# Patient Record
Sex: Female | Born: 1990 | Hispanic: Refuse to answer | Marital: Single | Smoking: Never smoker
Health system: Southern US, Community
[De-identification: ages and names within clinical notes are randomized; demographics above are authoritative.]

## PROBLEM LIST (undated history)

## (undated) DIAGNOSIS — K8020 Calculus of gallbladder without cholecystitis without obstruction: Secondary | ICD-10-CM

## (undated) DIAGNOSIS — J45909 Unspecified asthma, uncomplicated: Secondary | ICD-10-CM

## (undated) DIAGNOSIS — T7840XA Allergy, unspecified, initial encounter: Secondary | ICD-10-CM

## (undated) HISTORY — PX: EYE SURGERY: SHX253

## (undated) HISTORY — PX: WISDOM TOOTH EXTRACTION: SHX21

## (undated) HISTORY — DX: Unspecified asthma, uncomplicated: J45.909

## (undated) HISTORY — DX: Calculus of gallbladder without cholecystitis without obstruction: K80.20

## (undated) HISTORY — PX: JOINT REPLACEMENT: SHX530

## (undated) HISTORY — DX: Allergy, unspecified, initial encounter: T78.40XA

---
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
## 2011-04-04 ENCOUNTER — Ambulatory Visit (INDEPENDENT_AMBULATORY_CARE_PROVIDER_SITE_OTHER): Payer: BC Managed Care – PPO | Admitting: Internal Medicine

## 2011-04-04 ENCOUNTER — Encounter: Payer: Self-pay | Admitting: Internal Medicine

## 2011-04-04 ENCOUNTER — Other Ambulatory Visit (INDEPENDENT_AMBULATORY_CARE_PROVIDER_SITE_OTHER): Payer: BC Managed Care – PPO

## 2011-04-04 DIAGNOSIS — Z309 Encounter for contraceptive management, unspecified: Secondary | ICD-10-CM | POA: Insufficient documentation

## 2011-04-04 DIAGNOSIS — L259 Unspecified contact dermatitis, unspecified cause: Secondary | ICD-10-CM

## 2011-04-04 DIAGNOSIS — L309 Dermatitis, unspecified: Secondary | ICD-10-CM

## 2011-04-04 DIAGNOSIS — S83209A Unspecified tear of unspecified meniscus, current injury, unspecified knee, initial encounter: Secondary | ICD-10-CM

## 2011-04-04 DIAGNOSIS — IMO0002 Reserved for concepts with insufficient information to code with codable children: Secondary | ICD-10-CM

## 2011-04-04 DIAGNOSIS — Z0000 Encounter for general adult medical examination without abnormal findings: Secondary | ICD-10-CM

## 2011-04-04 LAB — CBC WITH DIFFERENTIAL/PLATELET
Basophils Absolute: 0 10*3/uL (ref 0.0–0.1)
Eosinophils Absolute: 0.1 10*3/uL (ref 0.0–0.7)
HCT: 36 % (ref 36.0–46.0)
Lymphs Abs: 2 10*3/uL (ref 0.7–4.0)
MCHC: 32.6 g/dL (ref 30.0–36.0)
MCV: 85.7 fl (ref 78.0–100.0)
Monocytes Absolute: 0.6 10*3/uL (ref 0.1–1.0)
Neutrophils Relative %: 49 % (ref 43.0–77.0)
Platelets: 230 10*3/uL (ref 150.0–400.0)
RDW: 14.7 % — ABNORMAL HIGH (ref 11.5–14.6)
WBC: 5.3 10*3/uL (ref 4.5–10.5)

## 2011-04-04 LAB — COMPREHENSIVE METABOLIC PANEL
CO2: 24 mEq/L (ref 19–32)
Calcium: 9.6 mg/dL (ref 8.4–10.5)
Chloride: 106 mEq/L (ref 96–112)
Creatinine, Ser: 0.9 mg/dL (ref 0.4–1.2)
GFR: 82.77 mL/min (ref >60.00)
Glucose, Bld: 85 mg/dL (ref 70–99)
Total Bilirubin: 0.3 mg/dL (ref 0.3–1.2)
Total Protein: 7.6 g/dL (ref 6.0–8.3)

## 2011-04-04 LAB — LIPID PANEL
HDL: 60.2 mg/dL (ref >39.00)
Triglycerides: 29 mg/dL (ref 0.0–149.0)

## 2011-04-04 LAB — TSH: TSH: 1.13 u[IU]/mL (ref 0.35–5.50)

## 2011-04-04 MED ORDER — NORGESTIM-ETH ESTRAD TRIPHASIC 0.18/0.215/0.25 MG-25 MCG PO TABS: 1.0000 | ORAL_TABLET | Freq: Every day | ORAL | Status: DC

## 2011-04-04 MED ORDER — PIMECROLIMUS 1 % EX CREA: TOPICAL_CREAM | CUTANEOUS | Status: AC

## 2011-04-04 NOTE — Patient Instructions (Signed)
Eczema / Atopic Dermatitis Atopic dermatitis, or eczema, is an inherited type of sensitive skin. Often people with eczema have a family history of allergies, asthma, or hay fever. It causes a red itchy rash and dry scaly skin. The itchiness may occur before the skin rash and may be very intense. It is not contagious. Eczema is generally worse during the cooler winter months and often improves with the warmth of summer. Eczema usually starts showing signs in infancy. Some children outgrow eczema, but it may last through adulthood. Flare-ups may be caused by:  Eating something or contact with something you are sensitive or allergic to.   Stress.  DIAGNOSIS The diagnosis of eczema is usually based upon symptoms and medical history. TREATMENT Eczema cannot be cured, but symptoms usually can be controlled with treatment or avoidance of allergens (things to which you are sensitive or allergic to).  Controlling the itching and scratching.   Use over-the-counter antihistamines as directed for itching. It is especially useful at night when the itching tends to be worse.   Use over-the-counter steroid creams as directed for itching.   Scratching makes the rash and itching worse and may cause impetigo (a skin infection) if fingernails are contaminated (dirty).   Keeping the skin well moisturized with creams every day. This will seal in moisture and help prevent dryness. Lotions containing alcohol and water can dry the skin and are not recommended.   Limiting exposure to allergens.   Recognizing situations that cause stress.   Developing a plan to manage stress.  HOME CARE INSTRUCTIONS  Take prescription and over-the-counter medicines as directed by your caregiver.   Do not use anything on the skin without checking with your caregiver.   Keep baths or showers short (5 minutes) in warm (not hot) water. Use mild cleansers for bathing. You may add non-perfumed bath oil to the bath water. It is best  to avoid soap and bubble bath.   Immediately after a bath or shower, when the skin is still damp, apply a moisturizing ointment to the entire body. This ointment should be a petroleum ointment. This will seal in moisture and help prevent dryness. The thicker the ointment the better. These should be unscented.   Keep fingernails cut short and wash hands often. If your child has eczema, it may be necessary to put soft gloves or mittens on your child at night.   Dress in clothes made of cotton or cotton blends. Dress lightly, as heat increases itching.   Avoid foods that may cause flare-ups. Common foods include cow's milk, peanut butter, eggs and wheat.   Keep a child with eczema away from anyone with fever blisters. The virus that causes fever blisters (herpes simplex) can cause a serious skin infection in children with eczema.  SEEK MEDICAL CARE IF:  Itching interferes with sleep.   The rash gets worse or is not better within one week following treatment.   The rash looks infected (pus or soft yellow scabs).   You or your child has an oral temperature above 102 F (38.9 C).   Your baby is older than 3 months with a rectal temperature of 100.5 F (38.1 C) or higher for more than 1 day.   The rash flares up after contact with someone who has fever blisters.  SEEK IMMEDIATE MEDICAL CARE IF:  Your baby is older than 3 months with a rectal temperature of 102 F (38.9 C) or higher.   Your baby is older than 3 months   or younger with a rectal temperature of 100.4 F (38 C) or higher.  Document Released: 08/16/2000 Document Re-Released: 11/13/2009 [employer] Patient Information 2011 [employer]. 

## 2011-04-04 NOTE — Assessment & Plan Note (Signed)
PT referral.

## 2011-04-04 NOTE — Assessment & Plan Note (Signed)
Exam done and routine labs ordered

## 2011-04-04 NOTE — Assessment & Plan Note (Addendum)
Try [facility], derm referral done

## 2011-04-04 NOTE — Progress Notes (Signed)
Subjective:    Patient ID: Alexandria Robinson, female    DOB: 1991/02/10, 20 y.o.   MRN: [id number]  HPI She is new to me and comes in for an annual exam but she has some complaints, concerns, and requests. She does not want me to do her PAP smear today - she asks me to refer her to a GYN. She wants a referral to PT to work on right knee pain and right leg weakness s/p meniscus repair. She has a rash around her neck that has not gone away with otc hydrocortisone cream and she wants to see a dermatologist.    Review of Systems  Constitutional: Negative for fever, chills, diaphoresis, activity change, appetite change, fatigue and unexpected weight change.  HENT: Negative for sore throat, facial swelling, trouble swallowing, neck pain, neck stiffness and voice change.   Eyes: Negative for photophobia, redness and visual disturbance.  Respiratory: Negative.   Cardiovascular: Negative.   Gastrointestinal: Negative.   Genitourinary: Negative for dysuria, urgency, frequency, hematuria, flank pain, decreased urine volume, vaginal bleeding, vaginal discharge, enuresis, difficulty urinating, genital sores, vaginal pain, menstrual problem, pelvic pain and dyspareunia.  Musculoskeletal: Positive for arthralgias (right knee). Negative for myalgias, back pain, joint swelling and gait problem.  Skin: Positive for rash. Negative for pallor and wound.  Neurological: Positive for weakness (right leg). Negative for dizziness, tremors, seizures, syncope, facial asymmetry, speech difficulty, light-headedness, numbness and headaches.  Hematological: Negative for adenopathy. Does not bruise/bleed easily.  Psychiatric/Behavioral: Negative.        Objective:   Physical Exam  Vitals reviewed. Constitutional: She is oriented to person, place, and time. She appears well-developed and well-nourished. No distress.  HENT:  Head: Normocephalic and atraumatic.  Right Ear: External ear normal.  Left Ear: External ear  normal.  Nose: Nose normal.  Mouth/Throat: Oropharynx is clear and moist. No oropharyngeal exudate.  Eyes: Conjunctivae and EOM are normal. Pupils are equal, round, and reactive to light. Right eye exhibits no discharge. Left eye exhibits no discharge. No scleral icterus.  Neck: Normal range of motion. Neck supple. No JVD present. No tracheal deviation present. No thyromegaly present.  Cardiovascular: Normal rate, regular rhythm, normal heart sounds and intact distal pulses.  Exam reveals no gallop and no friction rub.   No murmur heard. Pulmonary/Chest: Effort normal and breath sounds normal. No stridor. No respiratory distress. She has no wheezes. She has no rales. She exhibits no tenderness.  Abdominal: Soft. Bowel sounds are normal. She exhibits no distension and no mass. There is no tenderness. There is no rebound and no guarding.  Musculoskeletal: Normal range of motion. She exhibits no edema and no tenderness.  Lymphadenopathy:    She has no cervical adenopathy.  Neurological: She is alert and oriented to person, place, and time. She has normal reflexes. She displays normal reflexes. No cranial nerve deficit. She exhibits normal muscle tone. Coordination normal.  Skin: Skin is warm, dry and intact. Rash noted. No abrasion, no bruising, no burn, no ecchymosis, no laceration, no lesion, no petechiae and no purpura noted. Rash is not macular, not papular, not nodular, not pustular, not vesicular and not urticarial. She is not diaphoretic. No cyanosis or erythema. No pallor. Nails show no clubbing.          Around her neck she has a ring of lichenification with scale and mild erythema  Psychiatric: She has a normal mood and affect. Her behavior is normal. Judgment and thought content normal.  Assessment & Plan:

## 2011-04-04 NOTE — Assessment & Plan Note (Signed)
Continue OCP, she will see a GYN for a PAP smear in the next month

## 2011-07-03 ENCOUNTER — Encounter: Payer: Self-pay | Admitting: Internal Medicine

## 2011-07-03 ENCOUNTER — Ambulatory Visit (INDEPENDENT_AMBULATORY_CARE_PROVIDER_SITE_OTHER): Payer: BC Managed Care – PPO | Admitting: Internal Medicine

## 2011-07-03 VITALS — BP 104/62 | HR 60 | Temp 98.3°F | Resp 16 | Wt 179.0 lb

## 2011-07-03 DIAGNOSIS — J209 Acute bronchitis, unspecified: Secondary | ICD-10-CM

## 2011-07-03 MED ORDER — PSEUDOEPH-CHLORPHEN-HYDROCOD 60-4-5 MG/5ML PO SOLN: 5.0000 mL | Freq: Four times a day (QID) | ORAL | Status: DC | PRN

## 2011-07-03 MED ORDER — AZITHROMYCIN 500 MG PO TABS: 500.0000 mg | ORAL_TABLET | Freq: Every day | ORAL | Status: AC

## 2011-07-03 NOTE — Progress Notes (Signed)
  Subjective:    Patient ID: Alexandria Robinson, female    DOB: April 02, 1991, 20 y.o.   MRN: [id number]  Cough This is a new problem. The current episode started 1 to 4 weeks ago. The problem has been gradually worsening. The problem occurs every few hours. The cough is productive of purulent sputum. Associated symptoms include chills, nasal congestion, rhinorrhea and a sore throat. Pertinent negatives include no chest pain, ear congestion, ear pain, fever, headaches, heartburn, hemoptysis, myalgias, postnasal drip, rash, shortness of breath, sweats, weight loss or wheezing. The symptoms are aggravated by nothing. She has tried nothing for the symptoms. The treatment provided no relief.      Review of Systems  Constitutional: Positive for chills. Negative for fever, weight loss, diaphoresis, activity change, appetite change, fatigue and unexpected weight change.  HENT: Positive for congestion, sore throat and rhinorrhea. Negative for ear pain, facial swelling, trouble swallowing, neck pain, voice change and postnasal drip.   Eyes: Negative.   Respiratory: Positive for cough. Negative for apnea, hemoptysis, choking, chest tightness, shortness of breath, wheezing and stridor.   Cardiovascular: Negative for chest pain, palpitations and leg swelling.  Gastrointestinal: Negative.  Negative for heartburn.  Genitourinary: Negative.   Musculoskeletal: Negative for myalgias, back pain, joint swelling, arthralgias and gait problem.  Skin: Negative for color change, pallor, rash and wound.  Neurological: Negative for dizziness, tremors, seizures, syncope, facial asymmetry, speech difficulty, weakness, numbness and headaches.  Hematological: Negative for adenopathy. Does not bruise/bleed easily.  Psychiatric/Behavioral: Negative.        Objective:   Physical Exam  Vitals reviewed. Constitutional: She is oriented to person, place, and time. She appears well-developed and well-nourished. No distress.  HENT:    Head: Normocephalic and atraumatic. No trismus in the jaw.  Right Ear: Hearing, tympanic membrane, external ear and ear canal normal.  Left Ear: Hearing, tympanic membrane, external ear and ear canal normal.  Nose: Mucosal edema and rhinorrhea present. Right sinus exhibits no maxillary sinus tenderness and no frontal sinus tenderness. Left sinus exhibits no maxillary sinus tenderness and no frontal sinus tenderness.  Mouth/Throat: Mucous membranes are normal. Mucous membranes are not pale, not dry and not cyanotic. No uvula swelling. Posterior oropharyngeal erythema present. No oropharyngeal exudate, posterior oropharyngeal edema or tonsillar abscesses.  Eyes: Conjunctivae are normal. Right eye exhibits no discharge. Left eye exhibits no discharge. No scleral icterus.  Neck: Normal range of motion. Neck supple. No JVD present. No tracheal deviation present. No thyromegaly present.  Cardiovascular: Normal rate, regular rhythm, normal heart sounds and intact distal pulses.  Exam reveals no gallop and no friction rub.   No murmur heard. Pulmonary/Chest: Effort normal and breath sounds normal. No stridor. No respiratory distress. She has no wheezes. She has no rales. She exhibits no tenderness.  Abdominal: Soft. Bowel sounds are normal. She exhibits no distension and no mass. There is no tenderness. There is no rebound and no guarding.  Musculoskeletal: Normal range of motion. She exhibits no edema and no tenderness.  Lymphadenopathy:    She has no cervical adenopathy.  Neurological: She is oriented to person, place, and time. She displays normal reflexes. She exhibits normal muscle tone. Coordination normal.  Skin: Skin is warm and dry. No rash noted. She is not diaphoretic. No erythema. No pallor.  Psychiatric: She has a normal mood and affect. Her behavior is normal. Judgment and thought content normal.          Assessment & Plan:

## 2011-07-03 NOTE — Patient Instructions (Signed)

## 2011-07-03 NOTE — Assessment & Plan Note (Signed)
Start zpak for the infection and zutripro for the cough 

## 2011-07-19 ENCOUNTER — Encounter: Payer: Self-pay | Admitting: Internal Medicine

## 2011-07-19 ENCOUNTER — Ambulatory Visit (INDEPENDENT_AMBULATORY_CARE_PROVIDER_SITE_OTHER): Payer: BC Managed Care – PPO | Admitting: Internal Medicine

## 2011-07-19 DIAGNOSIS — J209 Acute bronchitis, unspecified: Secondary | ICD-10-CM

## 2011-07-19 DIAGNOSIS — Z23 Encounter for immunization: Secondary | ICD-10-CM

## 2011-07-19 DIAGNOSIS — Z309 Encounter for contraceptive management, unspecified: Secondary | ICD-10-CM

## 2011-07-19 MED ORDER — NORGESTIM-ETH ESTRAD TRIPHASIC 0.18/0.215/0.25 MG-25 MCG PO TABS: 1.0000 | ORAL_TABLET | Freq: Every day | ORAL | Status: DC

## 2011-07-19 NOTE — Assessment & Plan Note (Signed)
This has resolved.

## 2011-07-19 NOTE — Progress Notes (Signed)
  Subjective:    Patient ID: Alexandria Robinson, female    DOB: June 30, 1991, 20 y.o.   MRN: [id number]  URI  The current episode started 1 to 4 weeks ago. The problem has been gradually improving. There has been no fever. Pertinent negatives include no abdominal pain, chest pain, congestion, coughing, diarrhea, dysuria, ear pain, headaches, joint pain, joint swelling, nausea, neck pain, plugged ear sensation, rash, rhinorrhea, sinus pain, sneezing, sore throat, swollen glands, vomiting or wheezing. Treatments tried: antibiotics and cough meds. The treatment provided significant relief.      Review of Systems  Constitutional: Negative.   HENT: Negative.  Negative for ear pain, congestion, sore throat, rhinorrhea, sneezing and neck pain.   Eyes: Negative.   Respiratory: Negative.  Negative for cough and wheezing.   Cardiovascular: Negative.  Negative for chest pain.  Gastrointestinal: Negative.  Negative for nausea, vomiting, abdominal pain and diarrhea.  Genitourinary: Negative.  Negative for dysuria, decreased urine volume, vaginal bleeding, vaginal discharge, genital sores and vaginal pain.  Musculoskeletal: Negative.  Negative for joint pain.  Skin: Negative.  Negative for rash.  Neurological: Negative.  Negative for headaches.  Hematological: Negative.  Negative for adenopathy.  Psychiatric/Behavioral: Negative.        Objective:   Physical Exam  Vitals reviewed. Constitutional: She is oriented to person, place, and time. She appears well-developed and well-nourished. No distress.  HENT:  Head: Normocephalic and atraumatic.  Mouth/Throat: Oropharynx is clear and moist. No oropharyngeal exudate.  Eyes: Conjunctivae are normal. Right eye exhibits no discharge. Left eye exhibits no discharge. No scleral icterus.  Neck: Normal range of motion. Neck supple. No JVD present. No tracheal deviation present. No thyromegaly present.  Cardiovascular: Normal rate, regular rhythm, normal heart  sounds and intact distal pulses.  Exam reveals no gallop and no friction rub.   No murmur heard. Pulmonary/Chest: Effort normal and breath sounds normal. No stridor. No respiratory distress. She has no wheezes. She has no rales. She exhibits no tenderness.  Abdominal: Soft. Bowel sounds are normal. She exhibits no distension and no mass. There is no tenderness. There is no rebound and no guarding.  Musculoskeletal: Normal range of motion. She exhibits no edema and no tenderness.  Lymphadenopathy:    She has no cervical adenopathy.  Neurological: She is oriented to person, place, and time. Coordination normal.  Skin: Skin is warm and dry. No rash noted. She is not diaphoretic. No erythema. No pallor.  Psychiatric: She has a normal mood and affect. Her behavior is normal. Judgment and thought content normal.      Lab Results  Component Value Date   WBC 5.3 04/04/2011   HGB 11.7* 04/04/2011   HCT 36.0 04/04/2011   PLT 230.0 04/04/2011   GLUCOSE 85 04/04/2011   CHOL 113 04/04/2011   TRIG 29.0 04/04/2011   HDL 60.20 04/04/2011   LDLCALC 47 04/04/2011   ALT 11 04/04/2011   AST 17 04/04/2011   NA 140 04/04/2011   K 3.6 04/04/2011   CL 106 04/04/2011   CREATININE 0.9 04/04/2011   BUN 8 04/04/2011   CO2 24 04/04/2011   TSH 1.13 04/04/2011      Assessment & Plan:

## 2011-07-19 NOTE — Assessment & Plan Note (Signed)
She asked me to refill her OCP today but she did not permit me to do a PAP on her, she tells me that she has never had a PAP done, her last sexual encounter was almost one year ago, I gave her 4 months refill on the OCP but referred her to GYN for a PAP smear

## 2012-11-24 ENCOUNTER — Ambulatory Visit: Payer: BC Managed Care – PPO | Admitting: Physician Assistant

## 2012-11-24 VITALS — BP 102/68 | HR 77 | Temp 98.4°F | Resp 18 | Wt 195.0 lb

## 2012-11-24 DIAGNOSIS — R059 Cough, unspecified: Secondary | ICD-10-CM

## 2012-11-24 DIAGNOSIS — J3489 Other specified disorders of nose and nasal sinuses: Secondary | ICD-10-CM

## 2012-11-24 DIAGNOSIS — J309 Allergic rhinitis, unspecified: Secondary | ICD-10-CM

## 2012-11-24 DIAGNOSIS — R05 Cough: Secondary | ICD-10-CM

## 2012-11-24 DIAGNOSIS — R0981 Nasal congestion: Secondary | ICD-10-CM

## 2012-11-24 MED ORDER — AZITHROMYCIN 250 MG PO TABS: ORAL_TABLET | ORAL | Status: DC

## 2012-11-24 MED ORDER — HYDROCODONE-HOMATROPINE 5-1.5 MG/5ML PO SYRP
5.0000 mL | ORAL_SOLUTION | Freq: Three times a day (TID) | ORAL | Status: DC | PRN
Start: 2012-11-24 — End: 2013-02-01

## 2012-11-24 MED ORDER — IPRATROPIUM BROMIDE 0.03 % NA SOLN: 2.0000 | Freq: Two times a day (BID) | NASAL | Status: DC

## 2012-11-24 NOTE — Progress Notes (Signed)
  Subjective:    Patient ID: Alexandria Robinson, female    DOB: 1991-05-10, 22 y.o.   MRN: [id number]  HPI 22 year old female presents for evaluation of seasonal allergies. Admits she has been coughing for about 1 week and it is productive of yellow mucous sometimes.  She has rhinorrhea and PND with bilateral ear fullness.  History of seasonal allergies that she uses either Claritin or Zyrtec daily to treat. Currently taking Zyrtec daily.  Has seen Dr. Alwyn Ren in the past who rx'ed Hydromet cough syrup to use prn, and she is here today for a refill of this.  Denies fever, chills, nausea, vomiting, sore throat, HA, wheezing, or SOB.      Review of Systems  Constitutional: Negative for fever and chills.  HENT: Positive for congestion, rhinorrhea and postnasal drip. Negative for ear pain, sore throat, sinus pressure and ear discharge.   Respiratory: Positive for cough. Negative for shortness of breath and wheezing.   Gastrointestinal: Negative for nausea, vomiting and abdominal pain.  Allergic/Immunologic: Positive for environmental allergies.  Neurological: Negative for dizziness and headaches.       Objective:   Physical Exam  Constitutional: She is oriented to person, place, and time. She appears well-developed and well-nourished.  HENT:  Head: Normocephalic and atraumatic.  Right Ear: Hearing, tympanic membrane, external ear and ear canal normal.  Left Ear: Hearing, tympanic membrane, external ear and ear canal normal.  Nose: Nose normal. Right sinus exhibits no maxillary sinus tenderness and no frontal sinus tenderness. Left sinus exhibits no maxillary sinus tenderness and no frontal sinus tenderness.  Mouth/Throat: Uvula is midline, oropharynx is clear and moist and mucous membranes are normal.  Eyes: Conjunctivae are normal.  Neck: Normal range of motion. Neck supple.  Cardiovascular: Normal rate, regular rhythm and normal heart sounds.   Pulmonary/Chest: Effort normal and breath sounds  normal.  Lymphadenopathy:    She has no cervical adenopathy.  Neurological: She is oriented to person, place, and time.  Psychiatric: She has a normal mood and affect. Her behavior is normal. Judgment and thought content normal.          Assessment & Plan:  Cough - Plan: HYDROcodone-homatropine (HYCODAN) 5-1.5 MG/5ML syrup, azithromycin (ZITHROMAX) 250 MG tablet  Allergic rhinitis - Plan: ipratropium (ATROVENT) 0.03 % nasal spray  Nasal congestion - Plan: ipratropium (ATROVENT) 0.03 % nasal spray  Zpack as directed Atrovent NS bid to help with rhinorrhea Hycodan qhs prn cough Continue Zyrtec daily Follow up if symptoms worsen or fail to improve.

## 2012-12-03 ENCOUNTER — Ambulatory Visit (INDEPENDENT_AMBULATORY_CARE_PROVIDER_SITE_OTHER): Payer: BC Managed Care – PPO | Admitting: Internal Medicine

## 2012-12-03 ENCOUNTER — Encounter: Payer: Self-pay | Admitting: Internal Medicine

## 2012-12-03 VITALS — BP 104/64 | HR 62 | Temp 97.2°F | Resp 16 | Wt 186.2 lb

## 2012-12-03 DIAGNOSIS — J309 Allergic rhinitis, unspecified: Secondary | ICD-10-CM | POA: Insufficient documentation

## 2012-12-03 MED ORDER — MOMETASONE FUROATE 50 MCG/ACT NA SUSP: 4.0000 | Freq: Every day | NASAL | Status: DC

## 2012-12-03 NOTE — Progress Notes (Signed)
Subjective:    Patient ID: Alexandria Robinson, female    DOB: 11-06-1990, 21 y.o.   MRN: [id number]  Sinusitis This is a new problem. The current episode started in the past 7 days. The problem is unchanged. There has been no fever. The fever has been present for less than 1 day. Her pain is at a severity of 0/10. She is experiencing no pain. Associated symptoms include congestion and sneezing. Pertinent negatives include no chills, coughing, diaphoresis, ear pain, headaches, hoarse voice, neck pain, shortness of breath, sinus pressure, sore throat or swollen glands. Past treatments include antibiotics and spray decongestants. The treatment provided moderate relief.      Review of Systems  Constitutional: Negative.  Negative for fever, chills, diaphoresis, activity change, appetite change, fatigue and unexpected weight change.  HENT: Positive for congestion, rhinorrhea, sneezing and postnasal drip. Negative for ear pain, nosebleeds, sore throat, hoarse voice, facial swelling, mouth sores, neck pain, dental problem, voice change and sinus pressure.   Eyes: Negative.   Respiratory: Negative.  Negative for cough and shortness of breath.   Cardiovascular: Negative.  Negative for chest pain, palpitations and leg swelling.  Gastrointestinal: Negative.  Negative for nausea, vomiting, abdominal pain, diarrhea, constipation and blood in stool.  Endocrine: Negative.   Genitourinary: Negative.   Musculoskeletal: Negative.  Negative for myalgias, back pain, joint swelling, arthralgias and gait problem.  Skin: Negative.  Negative for color change, pallor, rash and wound.  Allergic/Immunologic: Negative.   Neurological: Negative.  Negative for dizziness and headaches.  Hematological: Negative.  Negative for adenopathy. Does not bruise/bleed easily.  Psychiatric/Behavioral: Negative.        Objective:   Physical Exam  Vitals reviewed. Constitutional: She is oriented to person, place, and time. She  appears well-developed and well-nourished.  Non-toxic appearance. She does not have a sickly appearance. She does not appear ill. No distress.  HENT:  Head: No trismus in the jaw.  Right Ear: Hearing, tympanic membrane, external ear and ear canal normal.  Left Ear: Hearing, tympanic membrane, external ear and ear canal normal.  Nose: Mucosal edema and rhinorrhea present. No nose lacerations, sinus tenderness, nasal deformity, septal deviation or nasal septal hematoma. No epistaxis.  No foreign bodies. Right sinus exhibits no maxillary sinus tenderness and no frontal sinus tenderness. Left sinus exhibits no maxillary sinus tenderness and no frontal sinus tenderness.  Mouth/Throat: Oropharynx is clear and moist and mucous membranes are normal. Mucous membranes are not pale, not dry and not cyanotic. No oral lesions. No edematous. No oropharyngeal exudate, posterior oropharyngeal edema, posterior oropharyngeal erythema or tonsillar abscesses.  Eyes: Conjunctivae are normal. Right eye exhibits no discharge. Left eye exhibits no discharge. No scleral icterus.  Neck: Normal range of motion. Neck supple. No JVD present. No tracheal deviation present. No thyromegaly present.  Cardiovascular: Normal rate, regular rhythm, normal heart sounds and intact distal pulses.  Exam reveals no gallop and no friction rub.   No murmur heard. Pulmonary/Chest: Effort normal and breath sounds normal. No stridor. No respiratory distress. She has no wheezes. She has no rales. She exhibits no tenderness.  Abdominal: Soft. Bowel sounds are normal. She exhibits no distension and no mass. There is no tenderness. There is no rebound and no guarding.  Musculoskeletal: Normal range of motion. She exhibits no edema and no tenderness.  Lymphadenopathy:    She has no cervical adenopathy.  Neurological: She is oriented to person, place, and time.  Skin: Skin is warm and dry. No rash noted. She  is not diaphoretic. No erythema. No pallor.   Psychiatric: She has a normal mood and affect. Her behavior is normal. Judgment and thought content normal.     Lab Results  Component Value Date   WBC 5.3 04/04/2011   HGB 11.7* 04/04/2011   HCT 36.0 04/04/2011   PLT 230.0 04/04/2011   GLUCOSE 85 04/04/2011   CHOL 113 04/04/2011   TRIG 29.0 04/04/2011   HDL 60.20 04/04/2011   LDLCALC 47 04/04/2011   ALT 11 04/04/2011   AST 17 04/04/2011   NA 140 04/04/2011   K 3.6 04/04/2011   CL 106 04/04/2011   CREATININE 0.9 04/04/2011   BUN 8 04/04/2011   CO2 24 04/04/2011   TSH 1.13 04/04/2011       Assessment & Plan:

## 2012-12-03 NOTE — Patient Instructions (Signed)
Allergic Rhinitis  Allergic rhinitis is when the mucous membranes in the nose respond to allergens. Allergens are particles in the air that cause your body to have an allergic reaction. This causes you to release allergic antibodies. Through a chain of events, these eventually cause you to release histamine into the blood stream (hence the use of antihistamines). Although meant to be protective to the body, it is this release that causes your discomfort, such as frequent sneezing, congestion and an itchy runny nose.    CAUSES    The pollen allergens may come from grasses, trees, and weeds. This is seasonal allergic rhinitis, or "hay fever." Other allergens cause year-round allergic rhinitis (perennial allergic rhinitis) such as house dust mite allergen, pet dander and mold spores.    SYMPTOMS     Nasal stuffiness (congestion).   Runny, itchy nose with sneezing and tearing of the eyes.   There is often an itching of the mouth, eyes and ears.  It cannot be cured, but it can be controlled with medications.  DIAGNOSIS    If you are unable to determine the offending allergen, skin or blood testing may find it.  TREATMENT     Avoid the allergen.   Medications and allergy shots (immunotherapy) can help.   Hay fever may often be treated with antihistamines in pill or nasal spray forms. Antihistamines block the effects of histamine. There are over-the-counter medicines that may help with nasal congestion and swelling around the eyes. Check with your caregiver before taking or giving this medicine.  If the treatment above does not work, there are many new medications your caregiver can prescribe. Stronger medications may be used if initial measures are ineffective. Desensitizing injections can be used if medications and avoidance fails. Desensitization is when a patient is given ongoing shots until the body becomes less sensitive to the allergen. Make sure you follow up with your caregiver if problems continue.   SEEK MEDICAL CARE IF:     You develop fever (more than 100.5 F (38.1 C).   You develop a cough that does not stop easily (persistent).   You have shortness of breath.   You start wheezing.   Symptoms interfere with normal daily activities.  Document Released: 05/14/2001 Document Revised: 11/11/2011 Document Reviewed: 11/23/2008  ExitCare Patient Information 2013 [employer].

## 2012-12-04 NOTE — Assessment & Plan Note (Signed)
Start nasonex ns 

## 2013-02-01 ENCOUNTER — Encounter: Payer: Self-pay | Admitting: Internal Medicine

## 2013-02-01 ENCOUNTER — Ambulatory Visit (INDEPENDENT_AMBULATORY_CARE_PROVIDER_SITE_OTHER): Payer: BC Managed Care – PPO | Admitting: Internal Medicine

## 2013-02-01 VITALS — BP 92/66 | HR 71 | Temp 97.7°F

## 2013-02-01 DIAGNOSIS — L608 Other nail disorders: Secondary | ICD-10-CM

## 2013-02-01 DIAGNOSIS — L603 Nail dystrophy: Secondary | ICD-10-CM
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
## 2013-02-01 NOTE — Patient Instructions (Signed)
Fungus Infection of the Skin  An infection of your skin caused by a fungus is a very common problem. Treatment depends on which part of the body is affected. Types of fungal skin infection include:  · Athlete's Foot(Tinea pedis). This infection starts between the toes and may involve the entire sole and sides of foot. It is the most common fungal disease. It is made worse by heat, moisture, and friction. To treat, wash your feet 2 to 3 times daily. Dry thoroughly between the toes. Use medicated foot powder or cream as directed on the package. Plain talc, cornstarch, or rice powder may be dusted into socks and shoes to keep the feet dry. Wearing footwear that allows ventilation is also helpful.  · Ringworm (Tinea corporis and tinea capitis). This infection causes scaly red rings to form on the skin or scalp. For skin sores, apply medicated lotion or cream as directed on the package. For the scalp, medicated shampoo may be used with with other therapies. Ringworm of the scalp or fingernails usually requires using oral medicine for 2 to 4 months.  · Tinea versicolor. This infection appears as painless, scaly, patchy areas of discolored skin (whitish to light brown). It is more common in the summer and favors oily areas of the skin such as those found at the chest, abdomen, back, pubis, neck, and body folds. It can be treated with medicated shampoo or with medicated topical cream. Oral antifungals may be needed for more active infections. The light and/or dark spots may take time to get better and is not a sign of treatment failure.  Fungal infections may need to be treated for several weeks to be cured. It is important not to treat fungal infections with steroids or combination medicine that contains an antifungal and steroid as these will make the fungal infection worse.  SEEK MEDICAL CARE IF:   · You have persistent itching or rawness.  · You have an oral temperature above 102° F (38.9° C).  Document Released:  09/26/2004 Document Revised: 11/11/2011 Document Reviewed: 12/12/2009  ExitCare® Patient Information ©2013 [employer].

## 2013-04-19 ENCOUNTER — Encounter: Payer: Self-pay | Admitting: Internal Medicine

## 2013-04-19 ENCOUNTER — Ambulatory Visit (INDEPENDENT_AMBULATORY_CARE_PROVIDER_SITE_OTHER): Payer: BC Managed Care – PPO | Admitting: Internal Medicine

## 2013-04-19 VITALS — BP 102/62 | HR 77 | Temp 97.5°F | Resp 16

## 2013-04-19 DIAGNOSIS — S91109A Unspecified open wound of unspecified toe(s) without damage to nail, initial encounter: Secondary | ICD-10-CM

## 2013-04-19 DIAGNOSIS — S91209A Unspecified open wound of unspecified toe(s) with damage to nail, initial encounter: Secondary | ICD-10-CM

## 2013-04-19 MED ORDER — CEFUROXIME AXETIL 500 MG PO TABS: 500.0000 mg | ORAL_TABLET | Freq: Two times a day (BID) | ORAL | Status: DC

## 2013-04-19 NOTE — Patient Instructions (Signed)
Nail Avulsion Injury Nail avulsion means that you have lost the whole, or part of a nail. The nail will usually grow back in 2 to 6 months. If your injury damaged the growth center of the nail, the nail may be deformed, split, or not stuck to the nail bed. Sometimes the avulsed nail is stitched back in place. This provides temporary protection to the nail bed until the new nail grows in.  HOME CARE INSTRUCTIONS   Raise (elevate) your injury as much as possible.  Protect the injury and cover it with bandages (dressings) or splints as instructed.  Change dressings as instructed. SEEK MEDICAL CARE IF:   There is increasing pain, redness, or swelling.  You cannot move your fingers or toes. Document Released: 09/26/2004 Document Revised: 11/11/2011 Document Reviewed: 07/21/2009 ExitCare Patient Information 2014 [employer].  

## 2013-04-19 NOTE — Progress Notes (Signed)
  Subjective:    Patient ID: Alexandria Robinson, female    DOB: February 22, 1991, 22 y.o.   MRN: [id number]  HPI  She returns today complaining that she hit her left great toe one week ago and lifted up the toenail, she has long acrylic nails on and she continues to re-injure the toe. Part of the base of the nail is out and she wants to get it stuck back in under the skin.  Review of Systems  All other systems reviewed and are negative.       Objective:   Physical Exam  Musculoskeletal:  Left great toe nail has been avulsed but the majority of the base of the nail is intact. There is no injury to the nail bed.  I did a digital block (0.5 cc of 0.5% marcaine at each of the 4 quadrants at the base of the toe). She tolerates this well and has good capillary refill. I irrigated out the nail bed and the base of the nail with very dilute betadine in sterile water. Once the toe was numb I lifted the nail and put it back underneath the eponychium. It remains in place. A bulky dressing was placed over the toe. She continue to have good capillary refill.          Assessment & Plan:

## 2013-04-19 NOTE — Assessment & Plan Note (Signed)
She will start ceftin to prevent this from becoming infected

## 2015-01-27 ENCOUNTER — Ambulatory Visit (INDEPENDENT_AMBULATORY_CARE_PROVIDER_SITE_OTHER): Payer: BLUE CROSS/BLUE SHIELD | Admitting: Family Medicine

## 2015-01-27 VITALS — BP 110/70 | HR 80 | Temp 98.4°F | Resp 16 | Ht 66.0 in | Wt 210.0 lb

## 2015-01-27 DIAGNOSIS — J069 Acute upper respiratory infection, unspecified: Secondary | ICD-10-CM | POA: Diagnosis not present

## 2015-01-27 DIAGNOSIS — J019 Acute sinusitis, unspecified: Secondary | ICD-10-CM | POA: Diagnosis not present

## 2015-01-27 MED ORDER — AMOXICILLIN 875 MG PO TABS
875.0000 mg | ORAL_TABLET | Freq: Two times a day (BID) | ORAL | Status: DC
Start: 2015-01-27 — End: 2015-05-16

## 2015-01-27 MED ORDER — FLUTICASONE PROPIONATE 50 MCG/ACT NA SUSP: 2.0000 | Freq: Every day | NASAL | Status: DC

## 2015-01-27 NOTE — Progress Notes (Signed)
  Subjective:  Patient ID: Alexandria Robinson, female    DOB: 04/11/1991  Age: 24 y.o. 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 She has been ill for about 11 days with an upper respiratory infection. Over the past weekend, about 5 days ago, she started getting worse with more purulent drainage from her nose. No fever. She does have some cough. Has had some morning sore throat. Has had some stuffiness of the years but it may have been from driving [place] the [place].  She is generally healthy. Her only medication is her birth control pills and her last menstrual cycle was the first part of this month   Objective:   No acute distress. TMs normal. Eyes PERRLA. Tender over the frontal sinuses. Throat clear. Neck supple without significant nodes. Chest clear to auscultation. Heart regular without murmur.  Assessment & Plan:   Assessment: Upper respiratory infection with probable secondary rhinosinusitis  Plan: Patient Instructions  Drink plenty of fluids to stay well hydrated, which helps keep the secretions thinner  Use the fluticasone spray 2 sprays each nostril once daily  Take the amoxicillin 875 one twice daily  Try to get enough rest  Return if problems     HOPPER,DAVID, MD 01/27/2015

## 2015-01-27 NOTE — Patient Instructions (Signed)
Drink plenty of fluids to stay well hydrated, which helps keep the secretions thinner  Use the fluticasone spray 2 sprays each nostril once daily  Take the amoxicillin 875 one twice daily  Try to get enough rest  Return if problems

## 2015-05-11 ENCOUNTER — Telehealth: Payer: Self-pay | Admitting: Internal Medicine

## 2015-05-11 DIAGNOSIS — K8020 Calculus of gallbladder without cholecystitis without obstruction: Secondary | ICD-10-CM | POA: Insufficient documentation

## 2015-05-11 NOTE — Telephone Encounter (Signed)
Patient was in ER on Sat with gall stones and she is needing to get set up with a surgeon.  She doesn't want to come in here for an OV.  She states she doesn't live in [place] and she will be her 9/13-9/15 and would like to see surgeon then.

## 2015-05-11 NOTE — Telephone Encounter (Signed)
Referral sent 

## 2015-05-11 NOTE — Telephone Encounter (Signed)
Pt has not been seen since 2014. Does an OV need to be scheduled?

## 2015-05-16 ENCOUNTER — Ambulatory Visit (INDEPENDENT_AMBULATORY_CARE_PROVIDER_SITE_OTHER): Payer: BLUE CROSS/BLUE SHIELD | Admitting: Gastroenterology

## 2015-05-16 ENCOUNTER — Encounter: Payer: Self-pay | Admitting: Gastroenterology

## 2015-05-16 VITALS — BP 108/64 | HR 76 | Ht 64.57 in | Wt 201.4 lb

## 2015-05-16 DIAGNOSIS — K8020 Calculus of gallbladder without cholecystitis without obstruction: Secondary | ICD-10-CM

## 2015-05-16 DIAGNOSIS — R1084 Generalized abdominal pain: Secondary | ICD-10-CM

## 2015-05-16 DIAGNOSIS — R938 Abnormal findings on diagnostic imaging of other specified body structures: Secondary | ICD-10-CM

## 2015-05-16 DIAGNOSIS — R9389 Abnormal findings on diagnostic imaging of other specified body structures: Secondary | ICD-10-CM

## 2015-05-16 NOTE — Progress Notes (Signed)
_                                                                                                                History of Present Illness:  Ms. Uhrich is a 24 year old Afro-American female referred at the request of Dr. Yetta Barre for evaluation of abdominal pain.  Approximately 8 days ago she developed severe midepigastric pain with radiation to the back.  Pain lasted about 90 minutes and was described as a 7 out of 10.  She was seen at a [facility] ER where abdominal ultrasound, which I reviewed, demonstrated single nonobstructing gallbladder stone.    She's had no recurrences of pain.  She takes oral contraception.  She has not had children.   Past Medical History  Diagnosis Date  . Allergy   . Gallstones   . Asthma    Past Surgical History  Procedure Laterality Date  . Meniscus repair  2010  . Eye surgery      tear duct  . Joint replacement    . Wisdom tooth extraction     family history includes Breast cancer in her maternal grandmother; Diabetes in her maternal grandfather; Heart disease in her paternal grandfather; Prostate cancer in her paternal uncle. Current Outpatient Prescriptions  Medication Sig Dispense Refill  . CREATINE PO Take 1 tablet by mouth daily.    Marland Kitchen HYDROcodone-acetaminophen (NORCO) 5-325 MG per tablet Take 1 tablet by mouth as needed for moderate pain.    Marland Kitchen MULTIPLE VITAMIN PO Take 1 tablet by mouth daily.    Lorita Officer Triphasic (TRINESSA, 28, PO) Take 1 tablet by mouth daily.    Marland Kitchen omeprazole (PRILOSEC) 20 MG capsule Take 20 mg by mouth daily.    . ondansetron (ZOFRAN) 4 MG tablet Take 4 mg by mouth every 8 (eight) hours as needed for nausea or vomiting.    . polyethylene glycol powder (MIRALAX) powder Take 17 g by mouth as needed.     No current facility-administered medications for this visit.   Allergies as of 05/16/2015  . (No Known Allergies)    reports that she has never smoked. She has never used smokeless tobacco. She  reports that she does not drink alcohol or use illicit drugs.   Review of Systems: Pertinent positive and negative review of systems were noted in the above HPI section. All other review of systems were otherwise negative.  Vital signs were reviewed in today's medical record Physical Exam: General: Well developed , well nourished, no acute distress Skin: anicteric Head: Normocephalic and atraumatic Eyes:  sclerae anicteric, EOMI Ears: Normal auditory acuity Mouth: No deformity or lesions Neck: Supple, no masses or thyromegaly Lymph Nodes: no lymphadenopathy Lungs: Clear throughout to auscultation Heart: Regular rate and rhythm; no murmurs, rubs or bruits Gastroinestinal: Soft, non tender and non distended. No masses, hepatosplenomegaly or hernias noted. Normal Bowel sounds Rectal:deferred Musculoskeletal: Symmetrical with no gross deformities  Skin: No lesions on visible extremities Pulses:  Normal pulses noted Extremities: No clubbing, cyanosis, edema or deformities  noted Neurological: Alert oriented x 4, grossly nonfocal Cervical Nodes:  No significant cervical adenopathy Inguinal Nodes: No significant inguinal adenopathy Psychological:  Alert and cooperative. Normal mood and affect  See Assessment and Plan under Problem List

## 2015-05-16 NOTE — Assessment & Plan Note (Addendum)
Symptoms are suggestive of acute cholecystitis although no acute findings were seen at ultrasound.  Recommendations #1 HIDA scan #2 check CBC, LFTs and lipase #3 patient was instructed to contact the office if she develops another episode of acute pain  CC Dr. Yetta Barre

## 2015-05-16 NOTE — Patient Instructions (Signed)
Go to the basement for labs today  You have been scheduled for a HIDA scan at [facility] (1st floor) on 05/24/2015. Please arrive 15 minutes prior to your scheduled appointment at  1:61WR. Make certain not to have anything to eat or drink at least 6 hours prior to your test. Should this appointment date or time not work well for you, please call radiology scheduling at [phone].  _____________________________________________________________________ hepatobiliary (HIDA) scan is an imaging procedure used to diagnose problems in the liver, gallbladder and bile ducts. In the HIDA scan, a radioactive chemical or tracer is injected into a vein in your arm. The tracer is handled by the liver like bile. Bile is a fluid produced and excreted by your liver that helps your digestive system break down fats in the foods you eat. Bile is stored in your gallbladder and the gallbladder releases the bile when you eat a meal. A special nuclear medicine scanner (gamma camera) tracks the flow of the tracer from your liver into your gallbladder and small intestine.  During your HIDA scan  You'll be asked to change into a hospital gown before your HIDA scan begins. Your health care team will position you on a table, usually on your back. The radioactive tracer is then injected into a vein in your arm.The tracer travels through your bloodstream to your liver, where it's taken up by the bile-producing cells. The radioactive tracer travels with the bile from your liver into your gallbladder and through your bile ducts to your small intestine.You may feel some pressure while the radioactive tracer is injected into your vein. As you lie on the table, a special gamma camera is positioned over your abdomen taking pictures of the tracer as it moves through your body. The gamma camera takes pictures continually for about an hour. You'll need to keep still during the HIDA scan. This can become uncomfortable, but you may find  that you can lessen the discomfort by taking deep breaths and thinking about other things. Tell your health care team if you're uncomfortable. The radiologist will watch on a computer the progress of the radioactive tracer through your body. The HIDA scan may be stopped when the radioactive tracer is seen in the gallbladder and enters your small intestine. This typically takes about an hour. In some cases extra imaging will be performed if original images aren't satisfactory, if morphine is given to help visualize the gallbladder or if the medication CCK is given to look at the contraction of the gallbladder. This test typically takes 2 hours to complete. ________________________________________________________________________

## 2015-05-24 ENCOUNTER — Ambulatory Visit (HOSPITAL_COMMUNITY): Payer: BLUE CROSS/BLUE SHIELD

## 2015-06-28 ENCOUNTER — Other Ambulatory Visit: Payer: Self-pay

## 2015-06-28 ENCOUNTER — Telehealth: Payer: Self-pay

## 2015-06-28 DIAGNOSIS — K8020 Calculus of gallbladder without cholecystitis without obstruction: Secondary | ICD-10-CM

## 2015-06-28 DIAGNOSIS — R1011 Right upper quadrant pain: Secondary | ICD-10-CM

## 2015-06-28 NOTE — Telephone Encounter (Signed)
Spoke with the patient. She is aware of the change of ordering physicians and the retirement of Dr Arlyce DiceKaplan. An appointment is scheduled for the first available which is 08/29/15.

## 2015-06-28 NOTE — Telephone Encounter (Signed)
Doc of the Day This is a patient new to our practice who was evaluated Dr Arlyce DiceKaplan. She is scheduled for a HIDA scan on 04/30/15 to evaluate her abdominal pain.  Gallstones without obstruction of gallbladder - Alexandria Meckelobert D Kaplan, MD at 05/16/2015 3:35 PM     Status: Alexandria OrnEdited Related Problem: Gallstones without obstruction of gallbladder   Expand All Collapse All   Symptoms are suggestive of acute cholecystitis although no acute findings were seen at ultrasound.  Recommendations #1 HIDA scan #2 check CBC, LFTs and lipase #3 patient was instructed to contact the office if she develops another episode of acute pain       The orders must be under a practicing provider's name that is credentialed by her insurance.

## 2015-06-28 NOTE — Telephone Encounter (Signed)
Scheduled for HIDA on 06/30/2015. OK to order under my name and please arrange follow up with VN, SA or HD.

## 2015-06-30 ENCOUNTER — Ambulatory Visit (HOSPITAL_COMMUNITY): Admission: RE | Admit: 2015-06-30 | Payer: BLUE CROSS/BLUE SHIELD | Source: Ambulatory Visit

## 2015-08-29 ENCOUNTER — Ambulatory Visit (HOSPITAL_COMMUNITY)
Admission: RE | Admit: 2015-08-29 | Discharge: 2015-08-29 | Disposition: A | Discharge status: Home / Self Care | Payer: BLUE CROSS/BLUE SHIELD | Source: Ambulatory Visit | Attending: Gastroenterology | Admitting: Gastroenterology

## 2015-08-29 ENCOUNTER — Ambulatory Visit: Payer: BLUE CROSS/BLUE SHIELD | Admitting: Gastroenterology

## 2015-08-29 DIAGNOSIS — K8020 Calculus of gallbladder without cholecystitis without obstruction: Secondary | ICD-10-CM

## 2015-08-29 DIAGNOSIS — R1011 Right upper quadrant pain: Secondary | ICD-10-CM | POA: Insufficient documentation

## 2015-08-29 MED ORDER — TECHNETIUM TC 99M MEBROFENIN IV KIT
5.3000 | PACK | Freq: Once | INTRAVENOUS | Status: AC | PRN
  Administered 2015-08-29: 5 via INTRAVENOUS

## 2015-08-29 MED ORDER — SINCALIDE 5 MCG IJ SOLR
0.0200 ug/kg | Freq: Once | INTRAMUSCULAR | Status: AC
  Administered 2015-08-29: 1.7 ug via INTRAVENOUS

## 2016-12-18 IMAGING — NM NM HEPATO W/GB/PHARM/[PERSON_NAME]
1 series · 12 of 12 positions shown · non-contrast
Comparison: None.

CLINICAL DATA: Abdominal pain.

EXAM:
NUCLEAR MEDICINE HEPATOBILIARY IMAGING WITH GALLBLADDER EF
TECHNIQUE: Sequential images of the abdomen were obtained [DATE] minutes
following intravenous administration of radiopharmaceutical. After
slow intravenous infusion of 1.7 micrograms Cholecystokinin,
gallbladder ejection fraction was determined.
RADIOPHARMACEUTICALS:  5.3 mCi Jc-88m Choletec IV

[Series 1: hepato · 4.46mm/px · 2 acquisitions, 12 frames shown]
[im 1/2]
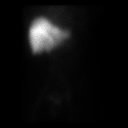
[im 1/2]
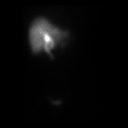
[im 1/2]
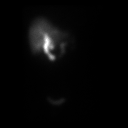
[im 1/2]
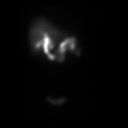
[im 1/2]
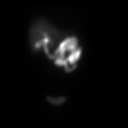
[im 1/2]
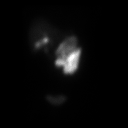
[im 2/2]
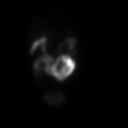
[im 2/2]
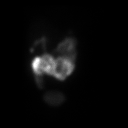
[im 2/2]
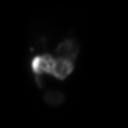
[im 2/2]
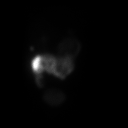
[im 2/2]
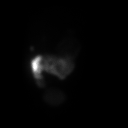
[im 2/2]
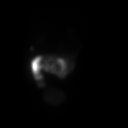

[12 of 12 positions shown; findings below may reference images not displayed]

FINDINGS: There is prompt uptake and excretion of radiotracer by the liver. No
evidence of cystic duct or common bile duct obstruction. Gallbladder
ejection fraction is 76%. At 60 min, normal ejection fraction is
greater than 40%.
IMPRESSION: Normal study.
